# Patient Record
Sex: Male | Born: 1985 | Race: White | Hispanic: No | Marital: Married | Smoking: Never smoker
Health system: Southern US, Community
[De-identification: ages and names within clinical notes are randomized; demographics above are authoritative.]

## PROBLEM LIST (undated history)

## (undated) HISTORY — PX: FOOT SURGERY: SHX648

---

## 2015-08-10 DIAGNOSIS — S0501XA Injury of conjunctiva and corneal abrasion without foreign body, right eye, initial encounter: Secondary | ICD-10-CM | POA: Diagnosis not present

## 2015-10-30 DIAGNOSIS — L237 Allergic contact dermatitis due to plants, except food: Secondary | ICD-10-CM | POA: Diagnosis not present

## 2016-03-17 DIAGNOSIS — Z3141 Encounter for fertility testing: Secondary | ICD-10-CM | POA: Diagnosis not present

## 2016-07-04 DIAGNOSIS — M5412 Radiculopathy, cervical region: Secondary | ICD-10-CM | POA: Diagnosis not present

## 2016-07-04 DIAGNOSIS — M9901 Segmental and somatic dysfunction of cervical region: Secondary | ICD-10-CM | POA: Diagnosis not present

## 2016-07-04 DIAGNOSIS — M5136 Other intervertebral disc degeneration, lumbar region: Secondary | ICD-10-CM | POA: Diagnosis not present

## 2016-07-04 DIAGNOSIS — M9903 Segmental and somatic dysfunction of lumbar region: Secondary | ICD-10-CM | POA: Diagnosis not present

## 2016-07-05 DIAGNOSIS — M9903 Segmental and somatic dysfunction of lumbar region: Secondary | ICD-10-CM | POA: Diagnosis not present

## 2016-07-05 DIAGNOSIS — M5412 Radiculopathy, cervical region: Secondary | ICD-10-CM | POA: Diagnosis not present

## 2016-07-05 DIAGNOSIS — M5136 Other intervertebral disc degeneration, lumbar region: Secondary | ICD-10-CM | POA: Diagnosis not present

## 2016-07-05 DIAGNOSIS — M9901 Segmental and somatic dysfunction of cervical region: Secondary | ICD-10-CM | POA: Diagnosis not present

## 2016-07-06 DIAGNOSIS — M9901 Segmental and somatic dysfunction of cervical region: Secondary | ICD-10-CM | POA: Diagnosis not present

## 2016-07-06 DIAGNOSIS — M9903 Segmental and somatic dysfunction of lumbar region: Secondary | ICD-10-CM | POA: Diagnosis not present

## 2016-07-06 DIAGNOSIS — M5136 Other intervertebral disc degeneration, lumbar region: Secondary | ICD-10-CM | POA: Diagnosis not present

## 2016-07-06 DIAGNOSIS — M5412 Radiculopathy, cervical region: Secondary | ICD-10-CM | POA: Diagnosis not present

## 2016-07-07 DIAGNOSIS — M9901 Segmental and somatic dysfunction of cervical region: Secondary | ICD-10-CM | POA: Diagnosis not present

## 2016-07-07 DIAGNOSIS — M5412 Radiculopathy, cervical region: Secondary | ICD-10-CM | POA: Diagnosis not present

## 2016-07-07 DIAGNOSIS — M5136 Other intervertebral disc degeneration, lumbar region: Secondary | ICD-10-CM | POA: Diagnosis not present

## 2016-07-07 DIAGNOSIS — M9903 Segmental and somatic dysfunction of lumbar region: Secondary | ICD-10-CM | POA: Diagnosis not present

## 2016-07-08 DIAGNOSIS — Z3141 Encounter for fertility testing: Secondary | ICD-10-CM | POA: Diagnosis not present

## 2016-07-12 DIAGNOSIS — M9903 Segmental and somatic dysfunction of lumbar region: Secondary | ICD-10-CM | POA: Diagnosis not present

## 2016-07-12 DIAGNOSIS — M9901 Segmental and somatic dysfunction of cervical region: Secondary | ICD-10-CM | POA: Diagnosis not present

## 2016-07-12 DIAGNOSIS — M5412 Radiculopathy, cervical region: Secondary | ICD-10-CM | POA: Diagnosis not present

## 2016-07-12 DIAGNOSIS — M5136 Other intervertebral disc degeneration, lumbar region: Secondary | ICD-10-CM | POA: Diagnosis not present

## 2016-07-13 DIAGNOSIS — M5412 Radiculopathy, cervical region: Secondary | ICD-10-CM | POA: Diagnosis not present

## 2016-07-13 DIAGNOSIS — M9901 Segmental and somatic dysfunction of cervical region: Secondary | ICD-10-CM | POA: Diagnosis not present

## 2016-07-13 DIAGNOSIS — M9903 Segmental and somatic dysfunction of lumbar region: Secondary | ICD-10-CM | POA: Diagnosis not present

## 2016-07-13 DIAGNOSIS — M5136 Other intervertebral disc degeneration, lumbar region: Secondary | ICD-10-CM | POA: Diagnosis not present

## 2016-07-15 DIAGNOSIS — M9901 Segmental and somatic dysfunction of cervical region: Secondary | ICD-10-CM | POA: Diagnosis not present

## 2016-07-15 DIAGNOSIS — M5136 Other intervertebral disc degeneration, lumbar region: Secondary | ICD-10-CM | POA: Diagnosis not present

## 2016-07-15 DIAGNOSIS — M5412 Radiculopathy, cervical region: Secondary | ICD-10-CM | POA: Diagnosis not present

## 2016-07-15 DIAGNOSIS — M9903 Segmental and somatic dysfunction of lumbar region: Secondary | ICD-10-CM | POA: Diagnosis not present

## 2016-07-18 DIAGNOSIS — M9903 Segmental and somatic dysfunction of lumbar region: Secondary | ICD-10-CM | POA: Diagnosis not present

## 2016-07-18 DIAGNOSIS — M9901 Segmental and somatic dysfunction of cervical region: Secondary | ICD-10-CM | POA: Diagnosis not present

## 2016-07-18 DIAGNOSIS — M5412 Radiculopathy, cervical region: Secondary | ICD-10-CM | POA: Diagnosis not present

## 2016-07-18 DIAGNOSIS — M5136 Other intervertebral disc degeneration, lumbar region: Secondary | ICD-10-CM | POA: Diagnosis not present

## 2016-07-20 DIAGNOSIS — M5412 Radiculopathy, cervical region: Secondary | ICD-10-CM | POA: Diagnosis not present

## 2016-07-20 DIAGNOSIS — M9903 Segmental and somatic dysfunction of lumbar region: Secondary | ICD-10-CM | POA: Diagnosis not present

## 2016-07-20 DIAGNOSIS — M5136 Other intervertebral disc degeneration, lumbar region: Secondary | ICD-10-CM | POA: Diagnosis not present

## 2016-07-20 DIAGNOSIS — M9901 Segmental and somatic dysfunction of cervical region: Secondary | ICD-10-CM | POA: Diagnosis not present

## 2016-07-21 DIAGNOSIS — M9903 Segmental and somatic dysfunction of lumbar region: Secondary | ICD-10-CM | POA: Diagnosis not present

## 2016-07-21 DIAGNOSIS — M9901 Segmental and somatic dysfunction of cervical region: Secondary | ICD-10-CM | POA: Diagnosis not present

## 2016-07-21 DIAGNOSIS — M5412 Radiculopathy, cervical region: Secondary | ICD-10-CM | POA: Diagnosis not present

## 2016-07-21 DIAGNOSIS — M5136 Other intervertebral disc degeneration, lumbar region: Secondary | ICD-10-CM | POA: Diagnosis not present

## 2016-07-26 DIAGNOSIS — M5136 Other intervertebral disc degeneration, lumbar region: Secondary | ICD-10-CM | POA: Diagnosis not present

## 2016-07-26 DIAGNOSIS — M9901 Segmental and somatic dysfunction of cervical region: Secondary | ICD-10-CM | POA: Diagnosis not present

## 2016-07-26 DIAGNOSIS — M5412 Radiculopathy, cervical region: Secondary | ICD-10-CM | POA: Diagnosis not present

## 2016-07-26 DIAGNOSIS — M9903 Segmental and somatic dysfunction of lumbar region: Secondary | ICD-10-CM | POA: Diagnosis not present

## 2016-07-28 DIAGNOSIS — M5412 Radiculopathy, cervical region: Secondary | ICD-10-CM | POA: Diagnosis not present

## 2016-07-28 DIAGNOSIS — M9901 Segmental and somatic dysfunction of cervical region: Secondary | ICD-10-CM | POA: Diagnosis not present

## 2016-07-28 DIAGNOSIS — M9903 Segmental and somatic dysfunction of lumbar region: Secondary | ICD-10-CM | POA: Diagnosis not present

## 2016-07-28 DIAGNOSIS — M5136 Other intervertebral disc degeneration, lumbar region: Secondary | ICD-10-CM | POA: Diagnosis not present

## 2016-08-02 DIAGNOSIS — M5412 Radiculopathy, cervical region: Secondary | ICD-10-CM | POA: Diagnosis not present

## 2016-08-02 DIAGNOSIS — M5136 Other intervertebral disc degeneration, lumbar region: Secondary | ICD-10-CM | POA: Diagnosis not present

## 2016-08-02 DIAGNOSIS — M9901 Segmental and somatic dysfunction of cervical region: Secondary | ICD-10-CM | POA: Diagnosis not present

## 2016-08-02 DIAGNOSIS — M9903 Segmental and somatic dysfunction of lumbar region: Secondary | ICD-10-CM | POA: Diagnosis not present

## 2016-08-04 DIAGNOSIS — M5412 Radiculopathy, cervical region: Secondary | ICD-10-CM | POA: Diagnosis not present

## 2016-08-04 DIAGNOSIS — M5136 Other intervertebral disc degeneration, lumbar region: Secondary | ICD-10-CM | POA: Diagnosis not present

## 2016-08-04 DIAGNOSIS — M9903 Segmental and somatic dysfunction of lumbar region: Secondary | ICD-10-CM | POA: Diagnosis not present

## 2016-08-04 DIAGNOSIS — M9901 Segmental and somatic dysfunction of cervical region: Secondary | ICD-10-CM | POA: Diagnosis not present

## 2016-10-03 DIAGNOSIS — Z3189 Encounter for other procreative management: Secondary | ICD-10-CM | POA: Diagnosis not present

## 2016-10-22 ENCOUNTER — Encounter: Payer: Self-pay | Admitting: Gynecology

## 2016-10-22 ENCOUNTER — Ambulatory Visit
Admission: EM | Admit: 2016-10-22 | Discharge: 2016-10-22 | Disposition: A | Discharge status: Home / Self Care | Payer: 59 | Attending: Family Medicine | Admitting: Family Medicine

## 2016-10-22 ENCOUNTER — Ambulatory Visit (INDEPENDENT_AMBULATORY_CARE_PROVIDER_SITE_OTHER): Payer: 59

## 2016-10-22 DIAGNOSIS — S99921A Unspecified injury of right foot, initial encounter: Secondary | ICD-10-CM

## 2016-10-22 DIAGNOSIS — M79671 Pain in right foot: Secondary | ICD-10-CM | POA: Diagnosis not present

## 2016-10-22 DIAGNOSIS — W208XXA Other cause of strike by thrown, projected or falling object, initial encounter: Secondary | ICD-10-CM

## 2016-10-22 MED ORDER — NAPROXEN 500 MG PO TABS: 500.0000 mg | ORAL_TABLET | Freq: Two times a day (BID) | ORAL | 0 refills | Status: AC

## 2016-10-22 NOTE — ED Provider Notes (Signed)
[facility]    CSN: [id number] Arrival date & time: 10/22/16  1210     History   Chief Complaint Chief Complaint  Patient presents with  . Foot Injury    HPI Bryce HaffGregory D Cooper is a 31 y.o. male.   Patient is a 31 year old male who presents complaining of right foot pain after dropping a 45 pound weight bar on top of his foot approximately 1 hour ago (at the time of this dictation). Patient has not taken any medications for this injury. 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      History reviewed. No pertinent past medical history.  There are no active problems to display for this patient.   Past Surgical History:  Procedure Laterality Date  . FOOT SURGERY        Home Medications    Prior to Admission medications   Medication Sig Start Date End Date Taking? Authorizing Provider  naproxen (NAPROSYN) 500 MG tablet Take 1 tablet (500 mg total) by mouth 2 (two) times daily. 10/22/16   Candis SchatzHarris, Mann Skaggs D, PA-C    Family History No family history on file.  Social History Social History  Substance Use Topics  . Smoking status: Never Smoker  . Smokeless tobacco: Never Used  . Alcohol use Yes     Allergies   Patient has no known allergies.   Review of Systems Review of Systems  As noted above in history of present illness. Other system reviewed and found to be negative or noncontributory   Physical Exam Triage Vital Signs ED Triage Vitals  Enc Vitals Group     BP 10/22/16 1223 132/85     Pulse Rate 10/22/16 1223 77     Resp 10/22/16 1223 16     Temp 10/22/16 1223 98.2 F (36.8 C)     Temp Source 10/22/16 1223 Oral     SpO2 10/22/16 1223 97 %     Weight 10/22/16 1223 175 lb (79.4 kg)     Height 10/22/16 1223 5\' 11"  (1.803 m)     Head Circumference --      Peak Flow --      Pain Score 10/22/16 1225 4     Pain Loc --      Pain Edu? --      Excl. in GC? --    No data  found.   Updated Vital Signs BP 132/85 (BP Location: Left Arm)   Pulse 77   Temp 98.2 F (36.8 C) (Oral)   Resp 16   Ht 5\' 11"  (1.803 m)   Wt 175 lb (79.4 kg)   SpO2 97%   BMI 24.41 kg/m   Physical Exam  Constitutional: He appears well-developed. No distress.  HENT:  Head: Normocephalic and atraumatic.  Eyes: Pupils are equal, round, and reactive to light. EOM are normal.  Neck: Normal range of motion.  Cardiovascular: Normal rate and regular rhythm.   Pulmonary/Chest: Effort normal.  Musculoskeletal:       Right foot: There is tenderness and swelling.       Feet:     UC Treatments / Results  Labs (all labs ordered are listed, but only abnormal results are displayed) Labs Reviewed - No data to display  EKG  EKG Interpretation None       Radiology Dg Foot Complete Right  Result Date: 10/22/2016 CLINICAL DATA:  Right anterior foot pain after dropping a 45  lb weight on top of his right foot today. Pt has a hx of right foot reconstruction as an infant for club foot. No other hx of trauma or injury. EXAM: RIGHT FOOT COMPLETE - 3+ VIEW COMPARISON:  None. FINDINGS: There is no evidence of fracture or dislocation. 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 There is no evidence of arthropathy or other focal bone abnormality. There is soft tissue swelling over the dorsal midfoot. IMPRESSION: No acute osseous injury of the right foot. 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 Electronically Signed   By: Elige Ko   On: 10/22/2016 13:17    Procedures Procedures (including critical care time)  Medications Ordered in UC Medications - No data to display   Initial Impression / Assessment and Plan / UC Course  I have reviewed the triage vital signs and the nursing notes.  Pertinent  labs & imaging results that were available during my care of the patient were reviewed by me and considered in my medical decision making (see chart for details).   patient dropped a 45 pound weight were on his foot at the gym this morning. Noted swelling, redness, and tenderness to palpation. X-ray shows no acute osseous injury but does show some abnormal articulation, likely related to his history club foot. Will give patient prescription for naproxen sodium as well as instructions for injury care (RICE). Also place foot and a hard bottom postop orthopedic shoe. Recommend follow with orthopedics should his pain and symptoms not improve after 2 weeks.  Final Clinical Impressions(s) / UC Diagnoses   Final diagnoses:  Injury of right foot, initial encounter    New Prescriptions New Prescriptions   NAPROXEN (NAPROSYN) 500 MG TABLET    Take 1 tablet (500 mg total) by mouth 2 (two) times daily.     Controlled Substance Prescriptions [place] Controlled Substance Registry consulted? Not Applicable   Candis Schatz, PA-C    Candis Schatz, PA-C 10/22/16 1332

## 2016-10-22 NOTE — ED Triage Notes (Signed)
Pr patient drop the weight bar on top of right foot at the gym today.

## 2016-10-22 NOTE — Discharge Instructions (Signed)
-  Naproxen: one tablet twice a day poor pain -Ice, elevation, rest: see attached -wear flat ortho shoe until pain and swelling -follow up with orthopedics in 2 weeks if no improvement or pain continues -two names listed for follow up are just one doctor at those two clinics, there are multiple providers at each location

## 2016-10-29 DIAGNOSIS — Z3189 Encounter for other procreative management: Secondary | ICD-10-CM | POA: Diagnosis not present

## 2016-11-25 DIAGNOSIS — Z3189 Encounter for other procreative management: Secondary | ICD-10-CM | POA: Diagnosis not present

## 2017-08-10 ENCOUNTER — Ambulatory Visit: Payer: Self-pay | Admitting: Family Medicine

## 2017-08-10 ENCOUNTER — Encounter: Payer: Self-pay | Admitting: Family Medicine

## 2017-08-10 VITALS — BP 122/82 | HR 64 | Temp 98.2°F | Wt 179.0 lb

## 2017-08-10 DIAGNOSIS — Z0000 Encounter for general adult medical examination without abnormal findings: Secondary | ICD-10-CM

## 2017-08-10 LAB — POCT URINALYSIS DIPSTICK
Bilirubin, UA: NEGATIVE
Glucose, UA: NEGATIVE
Ketones, UA: NEGATIVE
LEUKOCYTES UA: NEGATIVE
NITRITE UA: NEGATIVE
PROTEIN UA: NEGATIVE
RBC UA: NEGATIVE
SPEC GRAV UA: 1.015 (ref 1.010–1.025)
Urobilinogen, UA: 0.2 E.U./dL
pH, UA: 8.5 — AB (ref 5.0–8.0)

## 2017-08-10 NOTE — Patient Instructions (Signed)

## 2017-08-10 NOTE — Progress Notes (Signed)
Patient ID: Bryce Cooper, male    DOB: 03-12-1985, 32 y.o.   MRN: [id number]  PCP: Patient, No Pcp Per  Chief Complaint  Patient presents with  . save-wellness exam    Subjective:  HPI Bryce Cooper is a 32 y.o. male presents wellness visit in order to satisfy employee sponsored health insurance benefits. No recent follow-up with a primary care provider. He has no health concerns. Previously in the [employer] and last received as physical in 2016. He is very physically active, participating in cross-fit at least 5 days per week. Denies any exercise intolerance or chest tightness with vigorous physical activity.  Family history is significant for father with cardiovascular disease only. Family history is negative for diabetes or cancers. Social History   Socioeconomic History  . Marital status: Married    Spouse name: Not on file  . Number of children: Not on file  . Years of education: Not on file  . Highest education level: Not on file  Occupational History  . Not on file  Social Needs  . Financial resource strain: Not on file  . Food insecurity:    Worry: Not on file    Inability: Not on file  . Transportation needs:    Medical: Not on file    Non-medical: Not on file  Tobacco Use  . Smoking status: Never Smoker  . Smokeless tobacco: Never Used  Substance and Sexual Activity  . Alcohol use: Yes  . Drug use: Not on file  . Sexual activity: Not on file  Lifestyle  . Physical activity:    Days per week: Not on file    Minutes per session: Not on file  . Stress: Not on file  Relationships  . Social connections:    Talks on phone: Not on file    Gets together: Not on file    Attends religious service: Not on file    Active member of club or organization: Not on file    Attends meetings of clubs or organizations: Not on file    Relationship status: Not on file  . Intimate partner violence:    Fear of current or ex partner: Not on file    Emotionally abused: Not  on file    Physically abused: Not on file    Forced sexual activity: Not on file  Other Topics Concern  . Not on file  Social History Narrative  . Not on file    No family history on file.   Review of Systems Constitutional: Negative for fever, chills, diaphoresis, activity change, appetite change and fatigue. HENT: Negative for ear pain, nosebleeds, congestion, facial swelling, rhinorrhea, neck pain, neck stiffness and ear discharge.  Eyes: Negative for pain, discharge, redness, itching and visual disturbance. Respiratory: Negative for cough, choking, chest tightness, shortness of breath, wheezing and stridor.  Cardiovascular: Negative for chest pain, palpitations and leg swelling. Gastrointestinal: Negative for abdominal distention. Musculoskeletal: Negative for back pain, joint swelling, arthralgia and gait problem. Neurological: Negative for dizziness, tremors, seizures, syncope, facial asymmetry, speech difficulty, weakness, light-headedness, numbness and headaches.  No Known Allergies  Prior to Admission medications   Medication Sig Start Date End Date Taking? Authorizing Provider  naproxen (NAPROSYN) 500 MG tablet Take 1 tablet (500 mg total) by mouth 2 (two) times daily. Patient not taking: Reported on 08/10/2017 10/22/16   Candis Schatz, PA-C    Past Medical, Surgical Family and Social History reviewed and updated.    Objective:   Today's  Vitals   08/10/17 0855  BP: 122/82  Pulse: 64  Temp: 98.2 F (36.8 C)  SpO2: 98%  Weight: 179 lb (81.2 kg)    Wt Readings from Last 3 Encounters:  08/10/17 179 lb (81.2 kg)  10/22/16 175 lb (79.4 kg)    Physical Exam Constitutional: Patient appears well-developed and well-nourished. No distress. HENT: Normocephalic, atraumatic, External right and left ear normal. Oropharynx is clear and moist.  Eyes: Conjunctivae and EOM are normal. PERRLA, no scleral icterus. Neck: Normal ROM. Neck supple. No JVD. No tracheal  deviation. No thyromegaly. CVS: RRR, S1/S2 +, no murmurs, no gallops, no carotid bruit.  Pulmonary: Effort and breath sounds normal, no stridor, rhonchi, wheezes, rales.  Abdominal: Soft. BS +, no distension, tenderness, rebound or guarding.  Musculoskeletal: Normal range of motion. No edema and no tenderness.  Neuro: Alert. Normal reflexes, muscle tone coordination. No cranial nerve deficit. Skin: Skin is warm and dry. No rash noted. Not diaphoretic. No erythema. No pallor    Assessment & Plan:  1. Wellness examination Age-appropriate anticipatory guidance provided  Continue to engage in physical activity as tolerated with goal of 150 minutes per week. Improve dietary choices and eat a meal regimen consistent with a Mediterranean or DASH diet. Follow-up with PCP to obtain routine screening labs. - POCT Urinalysis Dipstick, negative    If symptoms worsen or do not improve, return for follow-up, follow-up with PCP, or at the emergency department if severity of symptoms warrant a higher level of care.    Godfrey PickKimberly S. Tiburcio PeaHarris, MSN, FNP-C [facility]  [address]  [place], [place] [id number] [phone]

## 2018-03-01 DIAGNOSIS — M5412 Radiculopathy, cervical region: Secondary | ICD-10-CM | POA: Diagnosis not present

## 2018-03-01 DIAGNOSIS — M5136 Other intervertebral disc degeneration, lumbar region: Secondary | ICD-10-CM | POA: Diagnosis not present

## 2018-03-01 DIAGNOSIS — M9903 Segmental and somatic dysfunction of lumbar region: Secondary | ICD-10-CM | POA: Diagnosis not present

## 2018-03-01 DIAGNOSIS — M9901 Segmental and somatic dysfunction of cervical region: Secondary | ICD-10-CM | POA: Diagnosis not present

## 2018-03-29 DIAGNOSIS — M5412 Radiculopathy, cervical region: Secondary | ICD-10-CM | POA: Diagnosis not present

## 2018-03-29 DIAGNOSIS — M5136 Other intervertebral disc degeneration, lumbar region: Secondary | ICD-10-CM | POA: Diagnosis not present

## 2018-03-29 DIAGNOSIS — M9903 Segmental and somatic dysfunction of lumbar region: Secondary | ICD-10-CM | POA: Diagnosis not present

## 2018-03-29 DIAGNOSIS — M9901 Segmental and somatic dysfunction of cervical region: Secondary | ICD-10-CM | POA: Diagnosis not present

## 2018-04-24 DIAGNOSIS — M5136 Other intervertebral disc degeneration, lumbar region: Secondary | ICD-10-CM | POA: Diagnosis not present

## 2018-04-24 DIAGNOSIS — M9901 Segmental and somatic dysfunction of cervical region: Secondary | ICD-10-CM | POA: Diagnosis not present

## 2018-04-24 DIAGNOSIS — M9903 Segmental and somatic dysfunction of lumbar region: Secondary | ICD-10-CM | POA: Diagnosis not present

## 2018-04-24 DIAGNOSIS — M5412 Radiculopathy, cervical region: Secondary | ICD-10-CM | POA: Diagnosis not present

## 2018-05-22 DIAGNOSIS — M9903 Segmental and somatic dysfunction of lumbar region: Secondary | ICD-10-CM | POA: Diagnosis not present

## 2018-05-22 DIAGNOSIS — M5136 Other intervertebral disc degeneration, lumbar region: Secondary | ICD-10-CM | POA: Diagnosis not present

## 2018-05-22 DIAGNOSIS — M9901 Segmental and somatic dysfunction of cervical region: Secondary | ICD-10-CM | POA: Diagnosis not present

## 2018-05-22 DIAGNOSIS — M5412 Radiculopathy, cervical region: Secondary | ICD-10-CM | POA: Diagnosis not present

## 2018-06-19 DIAGNOSIS — M9901 Segmental and somatic dysfunction of cervical region: Secondary | ICD-10-CM | POA: Diagnosis not present

## 2018-06-19 DIAGNOSIS — M5136 Other intervertebral disc degeneration, lumbar region: Secondary | ICD-10-CM | POA: Diagnosis not present

## 2018-06-19 DIAGNOSIS — M9903 Segmental and somatic dysfunction of lumbar region: Secondary | ICD-10-CM | POA: Diagnosis not present

## 2018-06-19 DIAGNOSIS — M5412 Radiculopathy, cervical region: Secondary | ICD-10-CM | POA: Diagnosis not present

## 2018-07-01 IMAGING — CR DG FOOT COMPLETE 3+V*R*
3 series · 3 of 3 positions shown · non-contrast
Comparison: None.

CLINICAL DATA: Right anterior foot pain after dropping a 45 lb
weight on top of his right foot today. Pt has a hx of right foot
reconstruction as an infant for club foot. No other hx of trauma or
injury.

EXAM:
RIGHT FOOT COMPLETE - 3+ VIEW

[foot ap]
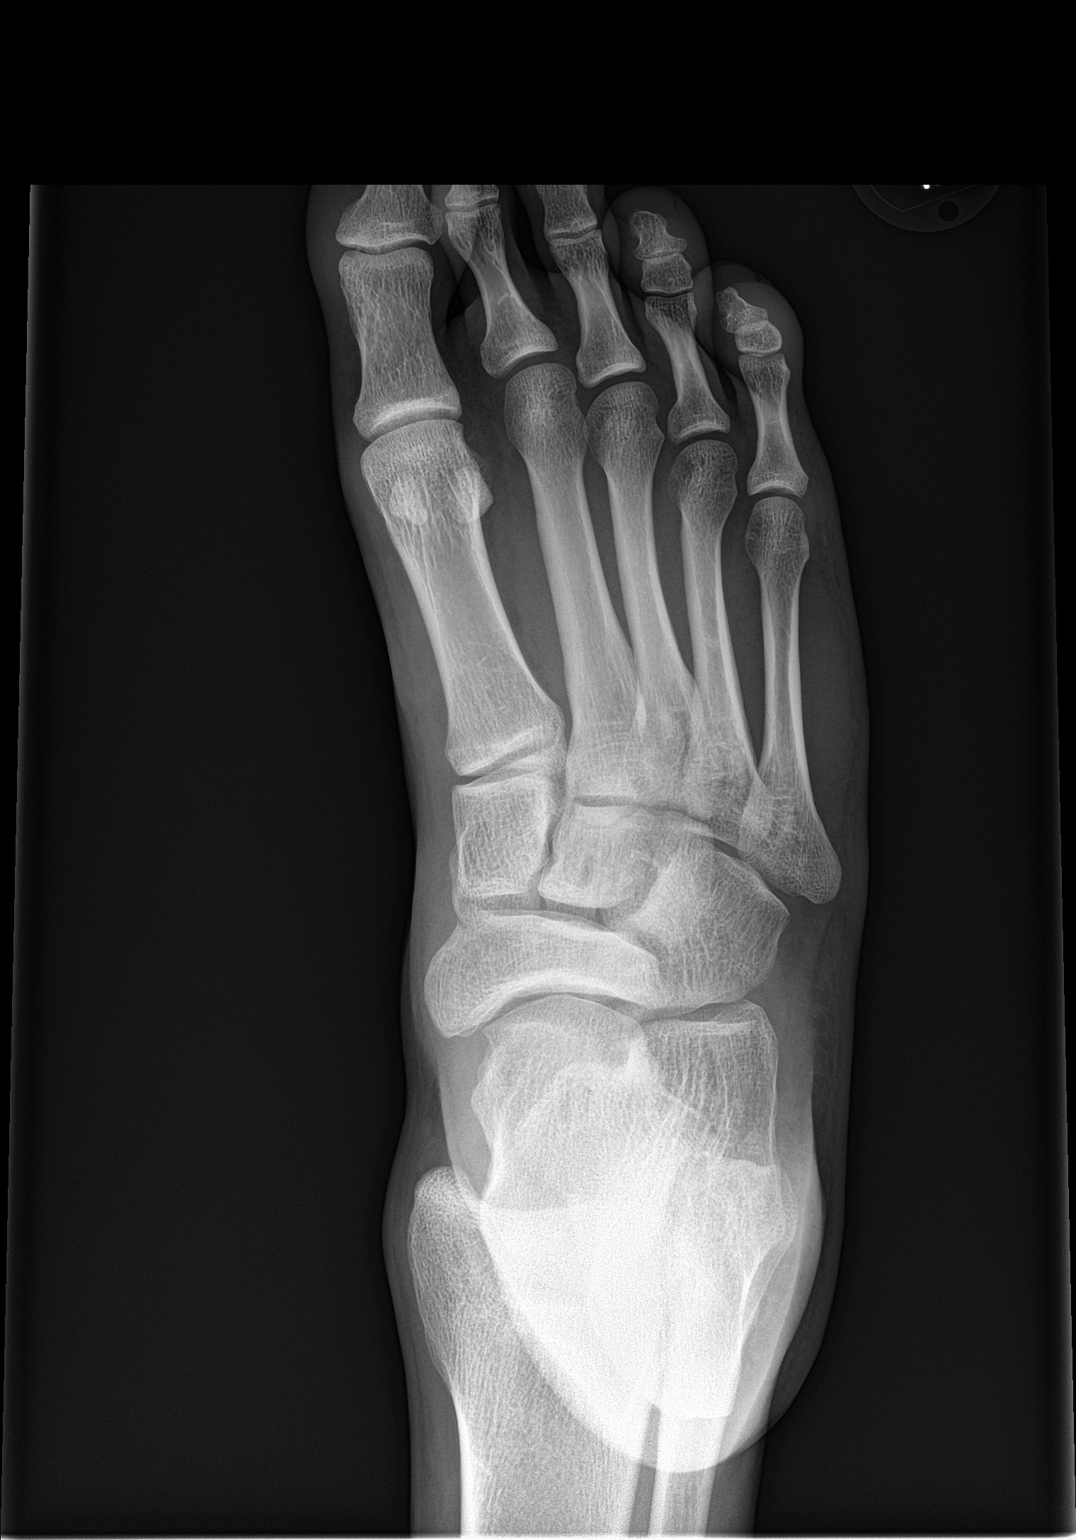

[foot obl]
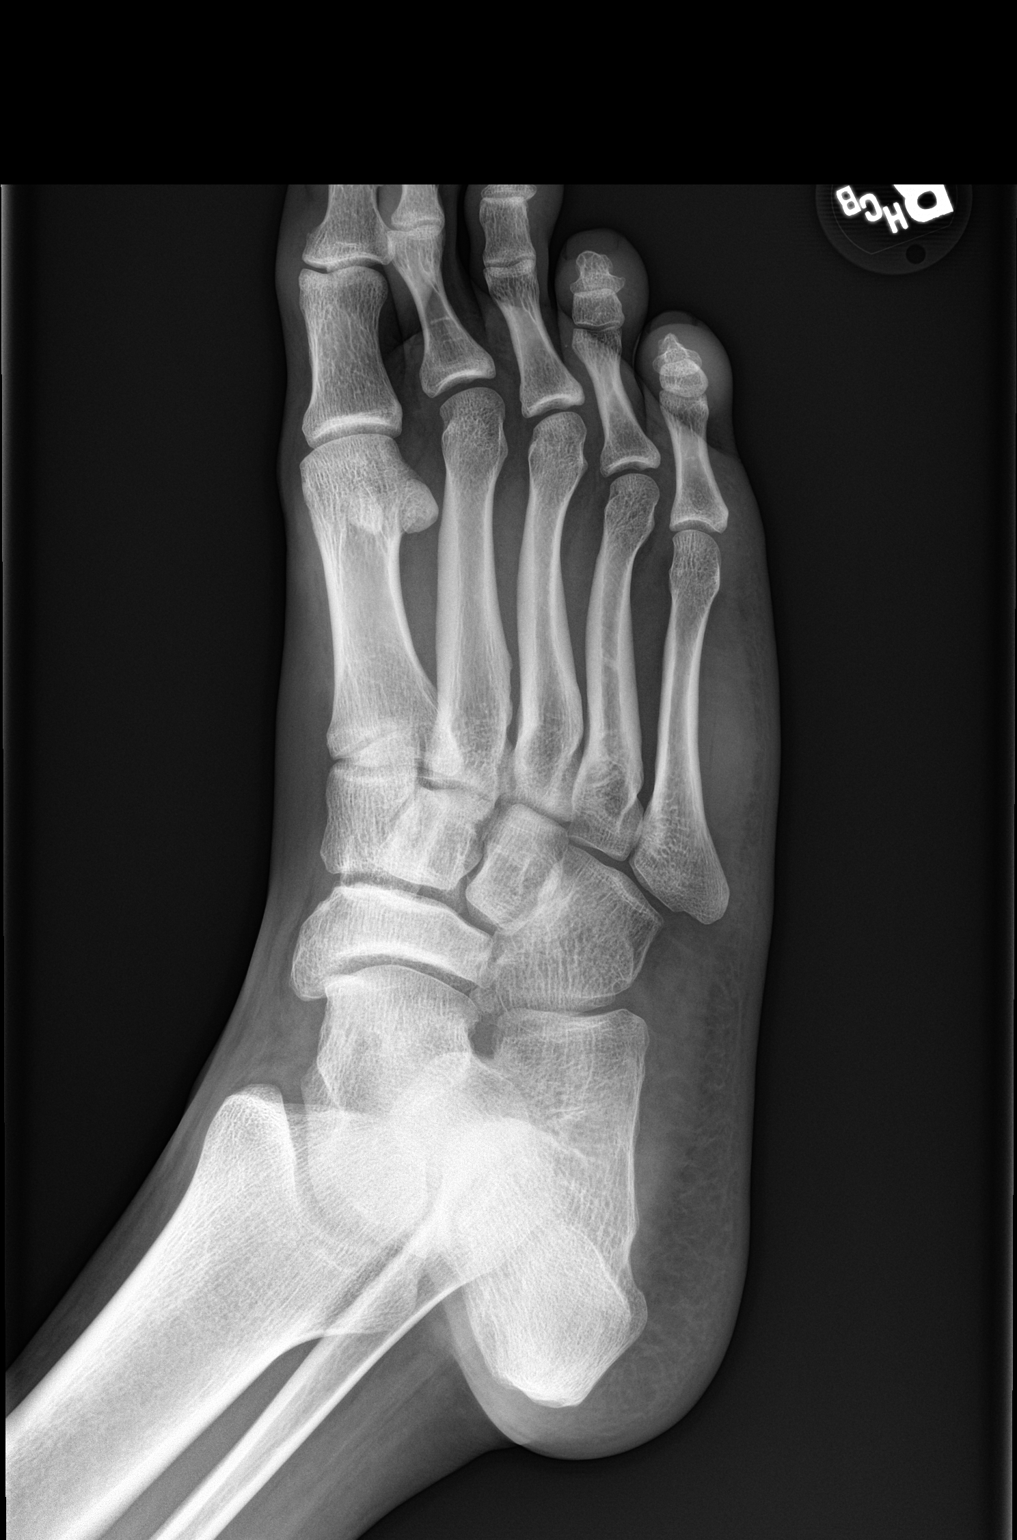

[foot lat]
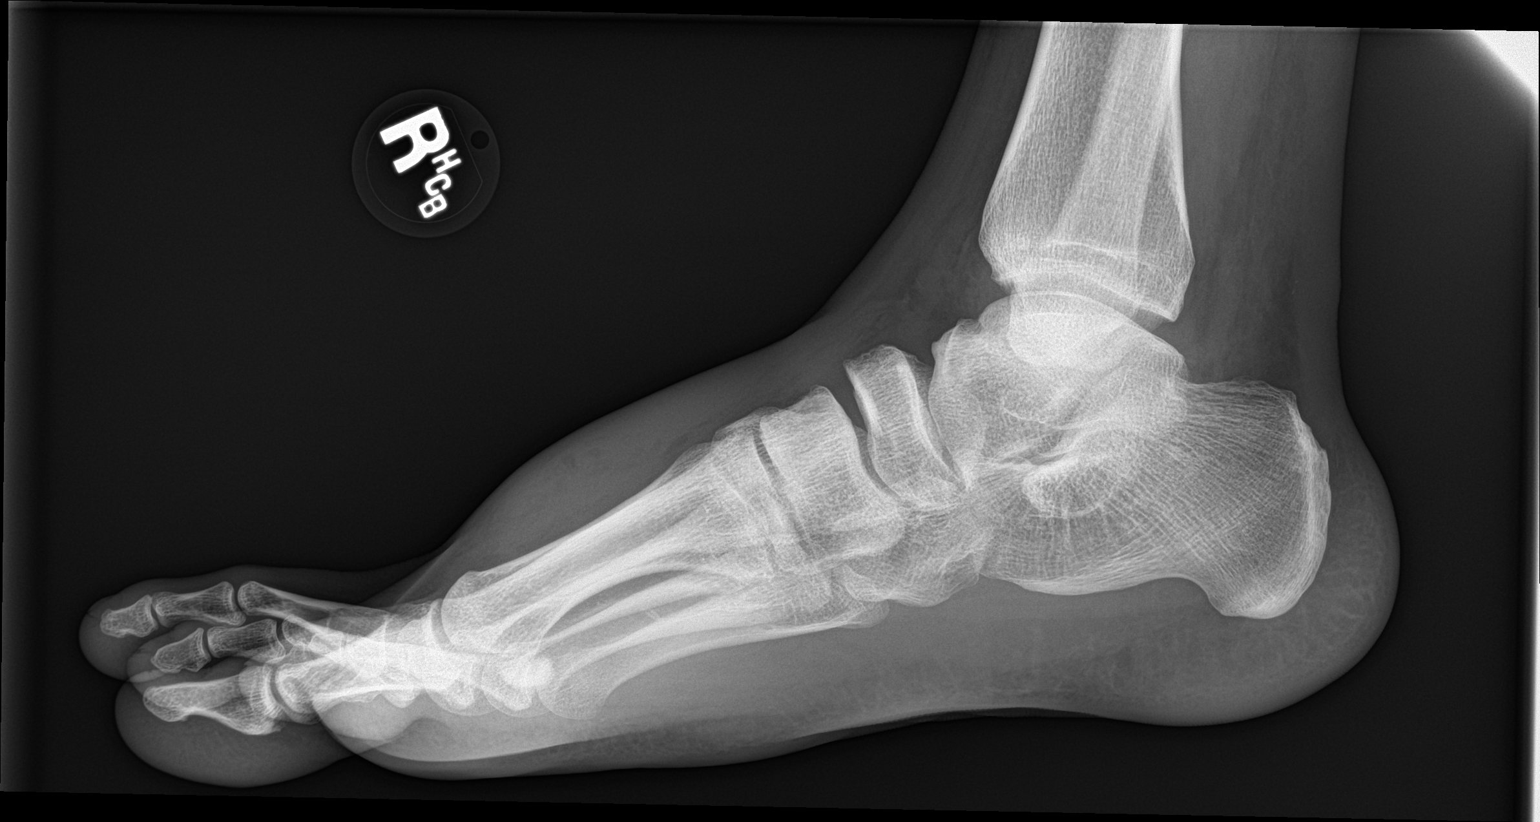

[3 of 3 positions shown; findings below may reference images not displayed]

FINDINGS: There is no evidence of fracture or dislocation. There is abnormal
articulation between the lateral aspect of the navicular and cuboid
concerning for cuboid navicular fibro-osseous coalition. There is no
evidence of arthropathy or other focal bone abnormality. There is
soft tissue swelling over the dorsal midfoot.
IMPRESSION: No acute osseous injury of the right foot.

Abnormal articulation between the lateral aspect of the navicular
and cuboid concerning for cuboid navicular fibro-osseous coalition.

## 2018-07-17 DIAGNOSIS — M5412 Radiculopathy, cervical region: Secondary | ICD-10-CM | POA: Diagnosis not present

## 2018-07-17 DIAGNOSIS — M5136 Other intervertebral disc degeneration, lumbar region: Secondary | ICD-10-CM | POA: Diagnosis not present

## 2018-07-17 DIAGNOSIS — M9903 Segmental and somatic dysfunction of lumbar region: Secondary | ICD-10-CM | POA: Diagnosis not present

## 2018-07-17 DIAGNOSIS — M9901 Segmental and somatic dysfunction of cervical region: Secondary | ICD-10-CM | POA: Diagnosis not present

## 2018-08-14 DIAGNOSIS — M5136 Other intervertebral disc degeneration, lumbar region: Secondary | ICD-10-CM | POA: Diagnosis not present

## 2018-08-14 DIAGNOSIS — M9903 Segmental and somatic dysfunction of lumbar region: Secondary | ICD-10-CM | POA: Diagnosis not present

## 2018-08-14 DIAGNOSIS — M5412 Radiculopathy, cervical region: Secondary | ICD-10-CM | POA: Diagnosis not present

## 2018-08-14 DIAGNOSIS — M9901 Segmental and somatic dysfunction of cervical region: Secondary | ICD-10-CM | POA: Diagnosis not present

## 2018-09-11 DIAGNOSIS — M5412 Radiculopathy, cervical region: Secondary | ICD-10-CM | POA: Diagnosis not present

## 2018-09-11 DIAGNOSIS — M5136 Other intervertebral disc degeneration, lumbar region: Secondary | ICD-10-CM | POA: Diagnosis not present

## 2018-09-11 DIAGNOSIS — M9901 Segmental and somatic dysfunction of cervical region: Secondary | ICD-10-CM | POA: Diagnosis not present

## 2018-09-11 DIAGNOSIS — M9903 Segmental and somatic dysfunction of lumbar region: Secondary | ICD-10-CM | POA: Diagnosis not present

## 2018-10-09 DIAGNOSIS — M9903 Segmental and somatic dysfunction of lumbar region: Secondary | ICD-10-CM | POA: Diagnosis not present

## 2018-10-09 DIAGNOSIS — M5136 Other intervertebral disc degeneration, lumbar region: Secondary | ICD-10-CM | POA: Diagnosis not present

## 2018-10-09 DIAGNOSIS — M9901 Segmental and somatic dysfunction of cervical region: Secondary | ICD-10-CM | POA: Diagnosis not present

## 2018-10-09 DIAGNOSIS — M5412 Radiculopathy, cervical region: Secondary | ICD-10-CM | POA: Diagnosis not present

## 2018-11-06 DIAGNOSIS — M9903 Segmental and somatic dysfunction of lumbar region: Secondary | ICD-10-CM | POA: Diagnosis not present

## 2018-11-06 DIAGNOSIS — M9901 Segmental and somatic dysfunction of cervical region: Secondary | ICD-10-CM | POA: Diagnosis not present

## 2018-11-06 DIAGNOSIS — M5136 Other intervertebral disc degeneration, lumbar region: Secondary | ICD-10-CM | POA: Diagnosis not present

## 2018-11-06 DIAGNOSIS — M5412 Radiculopathy, cervical region: Secondary | ICD-10-CM | POA: Diagnosis not present

## 2018-12-03 DIAGNOSIS — M9903 Segmental and somatic dysfunction of lumbar region: Secondary | ICD-10-CM | POA: Diagnosis not present

## 2018-12-03 DIAGNOSIS — M9901 Segmental and somatic dysfunction of cervical region: Secondary | ICD-10-CM | POA: Diagnosis not present

## 2018-12-03 DIAGNOSIS — M5136 Other intervertebral disc degeneration, lumbar region: Secondary | ICD-10-CM | POA: Diagnosis not present

## 2018-12-03 DIAGNOSIS — M5412 Radiculopathy, cervical region: Secondary | ICD-10-CM | POA: Diagnosis not present

## 2018-12-25 DIAGNOSIS — M5136 Other intervertebral disc degeneration, lumbar region: Secondary | ICD-10-CM | POA: Diagnosis not present

## 2018-12-25 DIAGNOSIS — M9903 Segmental and somatic dysfunction of lumbar region: Secondary | ICD-10-CM | POA: Diagnosis not present

## 2018-12-25 DIAGNOSIS — M5412 Radiculopathy, cervical region: Secondary | ICD-10-CM | POA: Diagnosis not present

## 2018-12-25 DIAGNOSIS — M9901 Segmental and somatic dysfunction of cervical region: Secondary | ICD-10-CM | POA: Diagnosis not present

## 2019-01-22 DIAGNOSIS — M9905 Segmental and somatic dysfunction of pelvic region: Secondary | ICD-10-CM | POA: Diagnosis not present

## 2019-01-22 DIAGNOSIS — M955 Acquired deformity of pelvis: Secondary | ICD-10-CM | POA: Diagnosis not present

## 2019-01-22 DIAGNOSIS — M9903 Segmental and somatic dysfunction of lumbar region: Secondary | ICD-10-CM | POA: Diagnosis not present

## 2019-01-22 DIAGNOSIS — M5136 Other intervertebral disc degeneration, lumbar region: Secondary | ICD-10-CM | POA: Diagnosis not present

## 2019-02-19 DIAGNOSIS — M5136 Other intervertebral disc degeneration, lumbar region: Secondary | ICD-10-CM | POA: Diagnosis not present

## 2019-02-19 DIAGNOSIS — M955 Acquired deformity of pelvis: Secondary | ICD-10-CM | POA: Diagnosis not present

## 2019-02-19 DIAGNOSIS — M9903 Segmental and somatic dysfunction of lumbar region: Secondary | ICD-10-CM | POA: Diagnosis not present

## 2019-02-19 DIAGNOSIS — M9905 Segmental and somatic dysfunction of pelvic region: Secondary | ICD-10-CM | POA: Diagnosis not present

## 2019-03-19 DIAGNOSIS — M955 Acquired deformity of pelvis: Secondary | ICD-10-CM | POA: Diagnosis not present

## 2019-03-19 DIAGNOSIS — M9905 Segmental and somatic dysfunction of pelvic region: Secondary | ICD-10-CM | POA: Diagnosis not present

## 2019-03-19 DIAGNOSIS — M9903 Segmental and somatic dysfunction of lumbar region: Secondary | ICD-10-CM | POA: Diagnosis not present

## 2019-03-19 DIAGNOSIS — M5136 Other intervertebral disc degeneration, lumbar region: Secondary | ICD-10-CM | POA: Diagnosis not present

## 2019-04-16 DIAGNOSIS — M5136 Other intervertebral disc degeneration, lumbar region: Secondary | ICD-10-CM | POA: Diagnosis not present

## 2019-04-16 DIAGNOSIS — M9903 Segmental and somatic dysfunction of lumbar region: Secondary | ICD-10-CM | POA: Diagnosis not present

## 2019-04-16 DIAGNOSIS — M9905 Segmental and somatic dysfunction of pelvic region: Secondary | ICD-10-CM | POA: Diagnosis not present

## 2019-04-16 DIAGNOSIS — M955 Acquired deformity of pelvis: Secondary | ICD-10-CM | POA: Diagnosis not present

## 2019-05-14 DIAGNOSIS — M955 Acquired deformity of pelvis: Secondary | ICD-10-CM | POA: Diagnosis not present

## 2019-05-14 DIAGNOSIS — M9905 Segmental and somatic dysfunction of pelvic region: Secondary | ICD-10-CM | POA: Diagnosis not present

## 2019-05-14 DIAGNOSIS — M9903 Segmental and somatic dysfunction of lumbar region: Secondary | ICD-10-CM | POA: Diagnosis not present

## 2019-05-14 DIAGNOSIS — M5136 Other intervertebral disc degeneration, lumbar region: Secondary | ICD-10-CM | POA: Diagnosis not present

## 2019-06-11 DIAGNOSIS — M9903 Segmental and somatic dysfunction of lumbar region: Secondary | ICD-10-CM | POA: Diagnosis not present

## 2019-06-11 DIAGNOSIS — M5136 Other intervertebral disc degeneration, lumbar region: Secondary | ICD-10-CM | POA: Diagnosis not present

## 2019-06-11 DIAGNOSIS — M9905 Segmental and somatic dysfunction of pelvic region: Secondary | ICD-10-CM | POA: Diagnosis not present

## 2019-06-11 DIAGNOSIS — M955 Acquired deformity of pelvis: Secondary | ICD-10-CM | POA: Diagnosis not present

## 2019-07-09 DIAGNOSIS — M9903 Segmental and somatic dysfunction of lumbar region: Secondary | ICD-10-CM | POA: Diagnosis not present

## 2019-07-09 DIAGNOSIS — M955 Acquired deformity of pelvis: Secondary | ICD-10-CM | POA: Diagnosis not present

## 2019-07-09 DIAGNOSIS — M9905 Segmental and somatic dysfunction of pelvic region: Secondary | ICD-10-CM | POA: Diagnosis not present

## 2019-07-09 DIAGNOSIS — M5136 Other intervertebral disc degeneration, lumbar region: Secondary | ICD-10-CM | POA: Diagnosis not present

## 2019-08-06 DIAGNOSIS — M9905 Segmental and somatic dysfunction of pelvic region: Secondary | ICD-10-CM | POA: Diagnosis not present

## 2019-08-06 DIAGNOSIS — M9903 Segmental and somatic dysfunction of lumbar region: Secondary | ICD-10-CM | POA: Diagnosis not present

## 2019-08-06 DIAGNOSIS — M5136 Other intervertebral disc degeneration, lumbar region: Secondary | ICD-10-CM | POA: Diagnosis not present

## 2019-08-06 DIAGNOSIS — M955 Acquired deformity of pelvis: Secondary | ICD-10-CM | POA: Diagnosis not present

## 2019-09-12 DIAGNOSIS — M9905 Segmental and somatic dysfunction of pelvic region: Secondary | ICD-10-CM | POA: Diagnosis not present

## 2019-09-12 DIAGNOSIS — M9903 Segmental and somatic dysfunction of lumbar region: Secondary | ICD-10-CM | POA: Diagnosis not present

## 2019-09-12 DIAGNOSIS — M5136 Other intervertebral disc degeneration, lumbar region: Secondary | ICD-10-CM | POA: Diagnosis not present

## 2019-09-12 DIAGNOSIS — M955 Acquired deformity of pelvis: Secondary | ICD-10-CM | POA: Diagnosis not present

## 2019-10-15 DIAGNOSIS — M9903 Segmental and somatic dysfunction of lumbar region: Secondary | ICD-10-CM | POA: Diagnosis not present

## 2019-10-15 DIAGNOSIS — M955 Acquired deformity of pelvis: Secondary | ICD-10-CM | POA: Diagnosis not present

## 2019-10-15 DIAGNOSIS — M5136 Other intervertebral disc degeneration, lumbar region: Secondary | ICD-10-CM | POA: Diagnosis not present

## 2019-10-15 DIAGNOSIS — M9905 Segmental and somatic dysfunction of pelvic region: Secondary | ICD-10-CM | POA: Diagnosis not present

## 2019-11-12 DIAGNOSIS — M5136 Other intervertebral disc degeneration, lumbar region: Secondary | ICD-10-CM | POA: Diagnosis not present

## 2019-11-12 DIAGNOSIS — M9905 Segmental and somatic dysfunction of pelvic region: Secondary | ICD-10-CM | POA: Diagnosis not present

## 2019-11-12 DIAGNOSIS — M955 Acquired deformity of pelvis: Secondary | ICD-10-CM | POA: Diagnosis not present

## 2019-11-12 DIAGNOSIS — M9903 Segmental and somatic dysfunction of lumbar region: Secondary | ICD-10-CM | POA: Diagnosis not present
